# Patient Record
Sex: Male | Born: 1985 | Race: Black or African American | Hispanic: No | Marital: Single | Smoking: Current every day smoker
Health system: Southern US, Community
[De-identification: ages and names within clinical notes are randomized; demographics above are authoritative.]

## PROBLEM LIST (undated history)

## (undated) DIAGNOSIS — I10 Essential (primary) hypertension: Secondary | ICD-10-CM

## (undated) DIAGNOSIS — R569 Unspecified convulsions: Secondary | ICD-10-CM

---

## 2010-02-05 ENCOUNTER — Emergency Department (HOSPITAL_COMMUNITY)
Admission: EM | Admit: 2010-02-05 | Discharge: 2010-02-05 | Payer: Self-pay | Source: Home / Self Care | Admitting: Emergency Medicine

## 2010-06-24 LAB — GC/CHLAMYDIA PROBE AMP, GENITAL
Chlamydia, DNA Probe: NEGATIVE
GC Probe Amp, Genital: NEGATIVE

## 2017-02-26 ENCOUNTER — Emergency Department (HOSPITAL_COMMUNITY): Payer: Self-pay

## 2017-02-26 ENCOUNTER — Encounter (HOSPITAL_COMMUNITY): Payer: Self-pay

## 2017-02-26 ENCOUNTER — Emergency Department (HOSPITAL_COMMUNITY)
Admission: EM | Admit: 2017-02-26 | Discharge: 2017-02-26 | Disposition: A | Discharge status: Home / Self Care | Payer: Self-pay | Attending: Emergency Medicine | Admitting: Emergency Medicine

## 2017-02-26 DIAGNOSIS — F17200 Nicotine dependence, unspecified, uncomplicated: Secondary | ICD-10-CM | POA: Insufficient documentation

## 2017-02-26 DIAGNOSIS — Z880 Allergy status to penicillin: Secondary | ICD-10-CM | POA: Insufficient documentation

## 2017-02-26 DIAGNOSIS — J029 Acute pharyngitis, unspecified: Secondary | ICD-10-CM | POA: Insufficient documentation

## 2017-02-26 DIAGNOSIS — F10929 Alcohol use, unspecified with intoxication, unspecified: Secondary | ICD-10-CM | POA: Insufficient documentation

## 2017-02-26 DIAGNOSIS — K122 Cellulitis and abscess of mouth: Secondary | ICD-10-CM | POA: Insufficient documentation

## 2017-02-26 LAB — I-STAT CHEM 8, ED
BUN: 7 mg/dL (ref 6–20)
CALCIUM ION: 1.09 mmol/L — AB (ref 1.15–1.40)
CHLORIDE: 106 mmol/L (ref 101–111)
Creatinine, Ser: 1.1 mg/dL (ref 0.61–1.24)
GLUCOSE: 88 mg/dL (ref 65–99)
HCT: 38 % — ABNORMAL LOW (ref 39.0–52.0)
HEMOGLOBIN: 12.9 g/dL — AB (ref 13.0–17.0)
POTASSIUM: 3.6 mmol/L (ref 3.5–5.1)
SODIUM: 142 mmol/L (ref 135–145)
TCO2: 26 mmol/L (ref 22–32)

## 2017-02-26 LAB — ETHANOL: Alcohol, Ethyl (B): 484 mg/dL (ref <10)

## 2017-02-26 LAB — RAPID URINE DRUG SCREEN, HOSP PERFORMED
Amphetamines: NOT DETECTED
Barbiturates: NOT DETECTED
Benzodiazepines: NOT DETECTED
COCAINE: NOT DETECTED
OPIATES: NOT DETECTED
TETRAHYDROCANNABINOL: NOT DETECTED

## 2017-02-26 LAB — CBG MONITORING, ED: GLUCOSE-CAPILLARY: 90 mg/dL (ref 65–99)

## 2017-02-26 LAB — RAPID STREP SCREEN (MED CTR MEBANE ONLY): STREPTOCOCCUS, GROUP A SCREEN (DIRECT): NEGATIVE

## 2017-02-26 MED ORDER — SODIUM CHLORIDE 0.9 % IV BOLUS (SEPSIS)
1000.0000 mL | Freq: Once | INTRAVENOUS | Status: AC
  Administered 2017-02-26: 1000 mL via INTRAVENOUS

## 2017-02-26 MED ORDER — PREDNISONE 20 MG PO TABS
40.0000 mg | ORAL_TABLET | Freq: Once | ORAL | Status: AC
  Administered 2017-02-26: 40 mg via ORAL
  Filled 2017-02-26: qty 2
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
## 2017-02-26 NOTE — ED Notes (Signed)
Pt sitting in bed dressed, IV pulled out, monitor off.  Notified Dr. Rosalia Hammersay who states she will talk to the patient
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
## 2017-02-28 LAB — CULTURE, GROUP A STREP (THRC)

## 2020-10-16 ENCOUNTER — Other Ambulatory Visit: Payer: Self-pay

## 2020-10-16 ENCOUNTER — Emergency Department (HOSPITAL_COMMUNITY)
Admission: EM | Admit: 2020-10-16 | Discharge: 2020-10-16 | Disposition: A | Discharge status: Home / Self Care | Payer: Self-pay | Attending: Emergency Medicine | Admitting: Emergency Medicine

## 2020-10-16 ENCOUNTER — Encounter (HOSPITAL_COMMUNITY): Payer: Self-pay | Admitting: Oncology

## 2020-10-16 DIAGNOSIS — Y990 Civilian activity done for income or pay: Secondary | ICD-10-CM | POA: Insufficient documentation

## 2020-10-16 DIAGNOSIS — W1830XA Fall on same level, unspecified, initial encounter: Secondary | ICD-10-CM | POA: Insufficient documentation

## 2020-10-16 DIAGNOSIS — Z79899 Other long term (current) drug therapy: Secondary | ICD-10-CM | POA: Insufficient documentation

## 2020-10-16 DIAGNOSIS — R569 Unspecified convulsions: Secondary | ICD-10-CM

## 2020-10-16 DIAGNOSIS — G40909 Epilepsy, unspecified, not intractable, without status epilepticus: Secondary | ICD-10-CM | POA: Insufficient documentation

## 2020-10-16 DIAGNOSIS — Z91148 Patient's other noncompliance with medication regimen for other reason: Secondary | ICD-10-CM

## 2020-10-16 DIAGNOSIS — I10 Essential (primary) hypertension: Secondary | ICD-10-CM

## 2020-10-16 DIAGNOSIS — Z9114 Patient's other noncompliance with medication regimen: Secondary | ICD-10-CM | POA: Insufficient documentation

## 2020-10-16 DIAGNOSIS — F1290 Cannabis use, unspecified, uncomplicated: Secondary | ICD-10-CM

## 2020-10-16 DIAGNOSIS — F17200 Nicotine dependence, unspecified, uncomplicated: Secondary | ICD-10-CM | POA: Insufficient documentation

## 2020-10-16 DIAGNOSIS — S50311A Abrasion of right elbow, initial encounter: Secondary | ICD-10-CM | POA: Insufficient documentation

## 2020-10-16 DIAGNOSIS — F1490 Cocaine use, unspecified, uncomplicated: Secondary | ICD-10-CM

## 2020-10-16 HISTORY — DX: Unspecified convulsions: R56.9

## 2020-10-16 HISTORY — DX: Essential (primary) hypertension: I10

## 2020-10-16 LAB — COMPREHENSIVE METABOLIC PANEL
ALT: 39 U/L (ref 0–44)
AST: 82 U/L — ABNORMAL HIGH (ref 15–41)
Albumin: 4.4 g/dL (ref 3.5–5.0)
Alkaline Phosphatase: 43 U/L (ref 38–126)
Anion gap: 14 (ref 5–15)
BUN: 13 mg/dL (ref 6–20)
CO2: 29 mmol/L (ref 22–32)
Calcium: 10.2 mg/dL (ref 8.9–10.3)
Chloride: 95 mmol/L — ABNORMAL LOW (ref 98–111)
Creatinine, Ser: 1.03 mg/dL (ref 0.61–1.24)
GFR, Estimated: >60 mL/min (ref >60)
Glucose, Bld: 81 mg/dL (ref 70–99)
Potassium: 3.5 mmol/L (ref 3.5–5.1)
Sodium: 138 mmol/L (ref 135–145)
Total Bilirubin: 0.7 mg/dL (ref 0.3–1.2)
Total Protein: 8.3 g/dL — ABNORMAL HIGH (ref 6.5–8.1)

## 2020-10-16 LAB — CBC WITH DIFFERENTIAL/PLATELET
Abs Immature Granulocytes: 0.01 10*3/uL (ref 0.00–0.07)
Basophils Absolute: 0 10*3/uL (ref 0.0–0.1)
Basophils Relative: 1 %
Eosinophils Absolute: 0.1 10*3/uL (ref 0.0–0.5)
Eosinophils Relative: 2 %
HCT: 34.9 % — ABNORMAL LOW (ref 39.0–52.0)
Hemoglobin: 12.1 g/dL — ABNORMAL LOW (ref 13.0–17.0)
Immature Granulocytes: 0 %
Lymphocytes Relative: 18 %
Lymphs Abs: 0.8 10*3/uL (ref 0.7–4.0)
MCH: 32.6 pg (ref 26.0–34.0)
MCHC: 34.7 g/dL (ref 30.0–36.0)
MCV: 94.1 fL (ref 80.0–100.0)
Monocytes Absolute: 0.6 10*3/uL (ref 0.1–1.0)
Monocytes Relative: 14 %
Neutro Abs: 2.9 10*3/uL (ref 1.7–7.7)
Neutrophils Relative %: 65 %
Platelets: 315 10*3/uL (ref 150–400)
RBC: 3.71 MIL/uL — ABNORMAL LOW (ref 4.22–5.81)
RDW: 13.7 % (ref 11.5–15.5)
WBC: 4.5 10*3/uL (ref 4.0–10.5)
nRBC: 0 % (ref 0.0–0.2)

## 2020-10-16 LAB — URINALYSIS, ROUTINE W REFLEX MICROSCOPIC
Bacteria, UA: NONE SEEN
Glucose, UA: NEGATIVE mg/dL
Hgb urine dipstick: NEGATIVE
Ketones, ur: 5 mg/dL — AB
Leukocytes,Ua: NEGATIVE
Nitrite: NEGATIVE
Protein, ur: >=300 mg/dL — AB
Specific Gravity, Urine: 1.025 (ref 1.005–1.030)
pH: 7 (ref 5.0–8.0)

## 2020-10-16 LAB — RAPID URINE DRUG SCREEN, HOSP PERFORMED
Amphetamines: NOT DETECTED
Barbiturates: NOT DETECTED
Benzodiazepines: NOT DETECTED
Cocaine: POSITIVE — AB
Opiates: NOT DETECTED
Tetrahydrocannabinol: POSITIVE — AB

## 2020-10-16 LAB — CBG MONITORING, ED: Glucose-Capillary: 90 mg/dL (ref 70–99)

## 2020-10-16 LAB — MAGNESIUM: Magnesium: 1.3 mg/dL — ABNORMAL LOW (ref 1.7–2.4)

## 2020-10-16 MED ORDER — SODIUM CHLORIDE 0.9 % IV SOLN: INTRAVENOUS | Status: DC

## 2020-10-16 MED ORDER — MAGNESIUM SULFATE 2 GM/50ML IV SOLN
2.0000 g | Freq: Once | INTRAVENOUS | Status: AC
  Administered 2020-10-16: 2 g via INTRAVENOUS
  Filled 2020-10-16: qty 50

## 2020-10-16 MED ORDER — AMLODIPINE BESYLATE 5 MG PO TABS
5.0000 mg | ORAL_TABLET | Freq: Once | ORAL | Status: AC
  Administered 2020-10-16: 5 mg via ORAL
  Filled 2020-10-16: qty 1

## 2020-10-16 MED ORDER — LEVETIRACETAM 500 MG PO TABS: 500.0000 mg | ORAL_TABLET | Freq: Two times a day (BID) | ORAL | 1 refills | Status: DC

## 2020-10-16 MED ORDER — AMLODIPINE BESYLATE 5 MG PO TABS: 5.0000 mg | ORAL_TABLET | Freq: Every day | ORAL | 1 refills | Status: DC

## 2020-10-16 MED ORDER — LEVETIRACETAM IN NACL 1000 MG/100ML IV SOLN
1000.0000 mg | Freq: Once | INTRAVENOUS | Status: AC
  Administered 2020-10-16: 1000 mg via INTRAVENOUS
  Filled 2020-10-16: qty 100

## 2020-10-16 NOTE — ED Triage Notes (Signed)
Pt bib [facility] from work s/p witnessed seizure.  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  Reports seizure lasted approximately 45 seconds to 1 minute.  Pt w/ hx of seizures and HTN has not been on his medications recently d/t no PCP.

## 2020-10-16 NOTE — ED Notes (Signed)
Per pt request, urinal placed at bedside.

## 2020-10-16 NOTE — ED Provider Notes (Signed)
[facility]-EMERGENCY DEPT Provider Note   CSN: [id number] Arrival date & time: 10/16/20  1628     History Chief Complaint  Patient presents with   Seizures    Ronald Avila is a 35 y.o. male.  Pt presents to the ED today with a witnessed seizure.  Pt was at work and a co-worker witnessed seizure activity for about 45 sec.  Pt has a hx of seizure d/o, but has not taken any of his meds for several months due to lack of insurance.  EMS said pt was post-ictal when they first arrived, but is now back to nl.  Pt said he has been sleeping ok.  He hit his right elbow, but he is able to move it without any problems.  Pt does not want an xray of the elbow.      Past Medical History:  Diagnosis Date   Hypertension    Seizure (HCC)    dx "Few years ago."    There are no problems to display for this patient.   History reviewed. No pertinent surgical history.     No family history on file.  Social History   Tobacco Use   Smoking status: Every Day    Pack years: 0.00   Smokeless tobacco: Current  Vaping Use   Vaping Use: Never used  Substance Use Topics   Alcohol use: Not Currently   Drug use: No    Home Medications Prior to Admission medications   Medication Sig Start Date End Date Taking? Authorizing Provider  amLODipine (NORVASC) 5 MG tablet Take 1 tablet (5 mg total) by mouth daily. 10/16/20  Yes Jacalyn Lefevre, MD  levETIRAcetam (KEPPRA) 500 MG tablet Take 1 tablet (500 mg total) by mouth 2 (two) times daily. 10/16/20  Yes Jacalyn Lefevre, MD  ibuprofen (ADVIL,MOTRIN) 200 MG tablet Take 200 mg every 6 (six) hours as needed by mouth (pain).    [provider]    Allergies    Amoxicillin-pot clavulanate and Penicillins  Review of Systems   Review of Systems  Neurological:  Positive for seizures.  All other systems reviewed and are negative.  Physical Exam Updated Vital Signs BP (!) 140/100   Pulse 85   Temp 98.1 F (36.7 C) (Oral)    Resp 15   Ht 5\' 4"  (1.626 m)   Wt 59 kg   SpO2 99%   BMI 22.31 kg/m   Physical Exam Vitals and nursing note reviewed.  Constitutional:      Appearance: Normal appearance.  HENT:     Head: Normocephalic and atraumatic.     Right Ear: External ear normal.     Left Ear: External ear normal.     Nose: Nose normal.     Mouth/Throat:     Mouth: Mucous membranes are moist.     Pharynx: Oropharynx is clear.  Eyes:     Extraocular Movements: Extraocular movements intact.     Conjunctiva/sclera: Conjunctivae normal.     Pupils: Pupils are equal, round, and reactive to light.  Cardiovascular:     Rate and Rhythm: Normal rate and regular rhythm.     Pulses: Normal pulses.     Heart sounds: Normal heart sounds.  Pulmonary:     Effort: Pulmonary effort is normal.     Breath sounds: Normal breath sounds.  Abdominal:     General: Abdomen is flat. Bowel sounds are normal.     Palpations: Abdomen is soft.  Musculoskeletal:  Cervical back: Normal range of motion and neck supple.     Comments: Abrasion to right elbow.  Good rom.  Skin:    General: Skin is warm.     Capillary Refill: Capillary refill takes less than 2 seconds.  Neurological:     General: No focal deficit present.     Mental Status: He is alert and oriented to person, place, and time.  Psychiatric:        Mood and Affect: Mood normal.        Behavior: Behavior normal.    ED Results / Procedures / Treatments   Labs (all labs ordered are listed, but only abnormal results are displayed) Labs Reviewed  CBC WITH DIFFERENTIAL/PLATELET - Abnormal; Notable for the following components:      Result Value   RBC 3.71 (*)    Hemoglobin 12.1 (*)    HCT 34.9 (*)    All other components within normal limits  COMPREHENSIVE METABOLIC PANEL - Abnormal; Notable for the following components:   Chloride 95 (*)    Total Protein 8.3 (*)    AST 82 (*)    All other components within normal limits  MAGNESIUM - Abnormal; Notable  for the following components:   Magnesium 1.3 (*)    All other components within normal limits  URINALYSIS, ROUTINE W REFLEX MICROSCOPIC - Abnormal; Notable for the following components:   Bilirubin Urine SMALL (*)    Ketones, ur 5 (*)    Protein, ur >=300 (*)    All other components within normal limits  RAPID URINE DRUG SCREEN, HOSP PERFORMED - Abnormal; Notable for the following components:   Cocaine POSITIVE (*)    Tetrahydrocannabinol POSITIVE (*)    All other components within normal limits  CBG MONITORING, ED    EKG None  Radiology No results found.  Procedures Procedures   Medications Ordered in ED Medications  0.9 %  sodium chloride infusion ( Intravenous New Bag/Given 10/16/20 1758)  magnesium sulfate IVPB 2 g 50 mL (2 g Intravenous New Bag/Given 10/16/20 1905)  levETIRAcetam (KEPPRA) IVPB 1000 mg/100 mL premix (0 mg Intravenous Stopped 10/16/20 1757)  amLODipine (NORVASC) tablet 5 mg (5 mg Oral Given 10/16/20 1902)    ED Course  I have reviewed the triage vital signs and the nursing notes.  Pertinent labs & imaging results that were available during my care of the patient were reviewed by me and considered in my medical decision making (see chart for details).    MDM Rules/Calculators/A&P                          SW did see pt and will help him get a pcp.  She will also help him with the keppra and norvasc.   His drug screen + for cocaine and mj.  He is encouraged to avoid these drugs.  He is to return if worse.  Establish care with pcp. Final Clinical Impression(s) / ED Diagnoses Final diagnoses:  Seizure (HCC)  Noncompliance with medications  Hypertension, unspecified type  Cocaine use  Marijuana use    Rx / DC Orders ED Discharge Orders          Ordered    levETIRAcetam (KEPPRA) 500 MG tablet  2 times daily        10/16/20 1833    amLODipine (NORVASC) 5 MG tablet  Daily        10/16/20 1833  Jacalyn Lefevre, MD 10/16/20 [phone]

## 2020-10-16 NOTE — Progress Notes (Addendum)
.  Transition of Care [facility]) - Emergency Department Mini Assessment   Patient Details  Name: Ronald Avila MRN: [id number] Date of Birth: [birth year]  Transition of [facility]) CM/SW Contact:    Elliot Cousin, RN Phone Number: [phone] 10/16/2020, 5:52 PM   Clinical Narrative: TOC CM spoke to pt and explained [facility] has discounted price for meds that he was taking in the past. Pt wants to use [employer], added this to his profile. Provided pt with contact number for [facility] Care at [facility] to arrange appt to see PCP on his dc paperwork to call to schedule appt. He will text CM with appt time on tomorrow. ED provided updated. Appts are out to Sept, ED provided updated.    ED Mini Assessment: What brought you to the Emergency Department? : seizures  Barriers to Discharge: No Barriers Identified  Barrier interventions: [facility] information to arrange PCP, mediation assistance     Interventions which prevented an admission or readmission: Medication Review, Follow-up medical appointment    Patient Contact and Communications   Admission diagnosis:  seizure There are no problems to display for this patient.  PCP:  Patient, No Pcp Per (Inactive) Pharmacy:   [facility] [address] [place] - [address] [place] [address] Phone: [phone] Fax: [phone]

## 2020-10-16 NOTE — Discharge Instructions (Addendum)
Avoid cocaine and marijuana.  You need to establish care with a primary care doctor and then a neurologist.

## 2020-12-09 ENCOUNTER — Emergency Department (HOSPITAL_COMMUNITY)
Admission: EM | Admit: 2020-12-09 | Discharge: 2020-12-10 | Disposition: A | Discharge status: Home / Self Care | Payer: Self-pay | Attending: Emergency Medicine | Admitting: Emergency Medicine

## 2020-12-09 DIAGNOSIS — Z23 Encounter for immunization: Secondary | ICD-10-CM | POA: Insufficient documentation

## 2020-12-09 DIAGNOSIS — Z79899 Other long term (current) drug therapy: Secondary | ICD-10-CM | POA: Insufficient documentation

## 2020-12-09 DIAGNOSIS — S01112A Laceration without foreign body of left eyelid and periocular area, initial encounter: Secondary | ICD-10-CM | POA: Insufficient documentation

## 2020-12-09 DIAGNOSIS — W01198A Fall on same level from slipping, tripping and stumbling with subsequent striking against other object, initial encounter: Secondary | ICD-10-CM | POA: Insufficient documentation

## 2020-12-09 DIAGNOSIS — Y92000 Kitchen of unspecified non-institutional (private) residence as  the place of occurrence of the external cause: Secondary | ICD-10-CM | POA: Insufficient documentation

## 2020-12-09 DIAGNOSIS — S0181XA Laceration without foreign body of other part of head, initial encounter: Secondary | ICD-10-CM

## 2020-12-09 DIAGNOSIS — F17200 Nicotine dependence, unspecified, uncomplicated: Secondary | ICD-10-CM | POA: Insufficient documentation

## 2020-12-09 DIAGNOSIS — S0990XA Unspecified injury of head, initial encounter: Secondary | ICD-10-CM

## 2020-12-09 DIAGNOSIS — I10 Essential (primary) hypertension: Secondary | ICD-10-CM | POA: Insufficient documentation
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
## 2020-12-09 NOTE — ED Triage Notes (Signed)
Pt states he had fell last night, hit head. Refused transport by EMS. States similar episode happened today, scared to wash face "cause it'll hurt." States he needs stitches. Endorses ETOH last night, unsure if passed out or had seizure. Hx seizures, HTN, states compliance w medication

## 2020-12-10 ENCOUNTER — Emergency Department (HOSPITAL_COMMUNITY): Payer: Self-pay

## 2020-12-10 MED ORDER — LIDOCAINE-EPINEPHRINE-TETRACAINE (LET) TOPICAL GEL
3.0000 mL | Freq: Once | TOPICAL | Status: AC
  Administered 2020-12-10: 3 mL via TOPICAL
  Filled 2020-12-10: qty 3

## 2020-12-10 MED ORDER — AMLODIPINE BESYLATE 5 MG PO TABS: 5.0000 mg | ORAL_TABLET | Freq: Every day | ORAL | 0 refills | Status: AC

## 2020-12-10 MED ORDER — LEVETIRACETAM 500 MG PO TABS
1000.0000 mg | ORAL_TABLET | Freq: Once | ORAL | Status: AC
  Administered 2020-12-10: 1000 mg via ORAL
  Filled 2020-12-10: qty 2

## 2020-12-10 MED ORDER — AMLODIPINE BESYLATE 5 MG PO TABS
5.0000 mg | ORAL_TABLET | Freq: Once | ORAL | Status: AC
  Administered 2020-12-10: 5 mg via ORAL
  Filled 2020-12-10: qty 1

## 2020-12-10 MED ORDER — TETANUS-DIPHTH-ACELL PERTUSSIS 5-2.5-18.5 LF-MCG/0.5 IM SUSY
0.5000 mL | PREFILLED_SYRINGE | Freq: Once | INTRAMUSCULAR | Status: AC
  Administered 2020-12-10: 0.5 mL via INTRAMUSCULAR
  Filled 2020-12-10: qty 0.5

## 2020-12-10 MED ORDER — DOXYCYCLINE HYCLATE 100 MG PO CAPS: 100.0000 mg | ORAL_CAPSULE | Freq: Two times a day (BID) | ORAL | 0 refills | Status: AC

## 2020-12-10 MED ORDER — LEVETIRACETAM 500 MG PO TABS: 500.0000 mg | ORAL_TABLET | Freq: Two times a day (BID) | ORAL | 0 refills | Status: AC

## 2020-12-10 NOTE — ED Notes (Signed)
Patient transported to CT 

## 2020-12-10 NOTE — Discharge Instructions (Addendum)
It is important for you to take your blood pressure and seizure medication as prescribed. Take Doxycycline to prevent infection in the facial laceration that was stitched.   Take ibuprofen as needed for discomfort.

## 2020-12-10 NOTE — ED Provider Notes (Signed)
[facility] EMERGENCY DEPARTMENT Provider Note   CSN: [id number] Arrival date & time: 12/09/20  2157     History Chief Complaint  Patient presents with   Seizures   Laceration   Fall    Ronald Avila is a 35 y.o. male.  Patient with history of seizures, HTN, medication noncompliance, presents with facial laceration from falling while at home and hitting the kitchen counter. He reports LOC. The episode was witnessed. No seizure activity was reported. He admits to not taking his Keppra in "I don't know how long". No nausea, vomiting. EMS was called to the house at the time of the fall (about 9 hours ago) and he reports he refused transport to the hospital. He states he got up tonight and saw the extent of the laceration, prompting ED evaluation. No neck pain, chest or abdominal pain, SOB, nausea or vomiting    Seizures Laceration Associated symptoms: no fever   Fall Pertinent negatives include no chest pain, no abdominal pain and no shortness of breath.      Past Medical History:  Diagnosis Date   Hypertension    Seizure (HCC)    dx "Few years ago."    There are no problems to display for this patient.   No past surgical history on file.     No family history on file.  Social History   Tobacco Use   Smoking status: Every Day   Smokeless tobacco: Current  Vaping Use   Vaping Use: Never used  Substance Use Topics   Alcohol use: Not Currently   Drug use: No    Home Medications Prior to Admission medications   Medication Sig Start Date End Date Taking? Authorizing Provider  amLODipine (NORVASC) 5 MG tablet Take 1 tablet (5 mg total) by mouth daily. 10/16/20   Jacalyn Lefevre, MD  ibuprofen (ADVIL,MOTRIN) 200 MG tablet Take 200 mg every 6 (six) hours as needed by mouth (pain).    [provider]  levETIRAcetam (KEPPRA) 500 MG tablet Take 1 tablet (500 mg total) by mouth 2 (two) times daily. 10/16/20   Jacalyn Lefevre, MD    Allergies     Amoxicillin-pot clavulanate and Penicillins  Review of Systems   Review of Systems  Constitutional:  Negative for fever.  HENT:  Positive for facial swelling.   Eyes:  Negative for visual disturbance.  Respiratory:  Negative for shortness of breath.   Cardiovascular:  Negative for chest pain.  Gastrointestinal:  Negative for abdominal pain, nausea and vomiting.  Musculoskeletal:  Negative for back pain and neck pain.  Skin:  Positive for wound.  Neurological:  Positive for seizures and syncope.   Physical Exam Updated Vital Signs BP (!) 165/118   Pulse 90   Temp 98.5 F (36.9 C) (Oral)   Resp 15   SpO2 100%   Physical Exam Vitals and nursing note reviewed.  Constitutional:      Appearance: He is well-developed.  HENT:     Head: Normocephalic.  Cardiovascular:     Rate and Rhythm: Normal rate and regular rhythm.     Heart sounds: No murmur heard. Pulmonary:     Effort: Pulmonary effort is normal.     Breath sounds: Normal breath sounds. No wheezing, rhonchi or rales.  Abdominal:     General: Bowel sounds are normal.     Palpations: Abdomen is soft.     Tenderness: There is no abdominal tenderness. There is no guarding or rebound.  Musculoskeletal:  General: Normal range of motion.     Cervical back: Normal range of motion and neck supple.  Skin:    General: Skin is warm and dry.     Comments: 6 cm crescent shaped laceration across forehead above the left eyebrow.   Neurological:     General: No focal deficit present.     Mental Status: He is alert and oriented to person, place, and time.    ED Results / Procedures / Treatments   Labs (all labs ordered are listed, but only abnormal results are displayed) Labs Reviewed - No data to display  EKG None  Radiology No results found. CT HEAD WO CONTRAST ( )  Result Date: 12/10/2020 CLINICAL DATA:  Seizure with fall. EXAM: CT HEAD WITHOUT CONTRAST CT MAXILLOFACIAL WITHOUT CONTRAST TECHNIQUE: Multidetector  CT imaging of the head and maxillofacial structures were performed using the standard protocol without intravenous contrast. Multiplanar CT image reconstructions of the maxillofacial structures were also generated. COMPARISON:  Head CT 02/26/2017. FINDINGS: CT HEAD FINDINGS Brain: There is no evidence for acute hemorrhage, hydrocephalus, mass lesion, or abnormal extra-axial fluid collection. No definite CT evidence for acute infarction. Vascular: No hyperdense vessel or unexpected calcification. Skull: No evidence for fracture. No worrisome lytic or sclerotic lesion. Other: None. CT MAXILLOFACIAL FINDINGS Osseous: No fracture or mandibular dislocation. No destructive process. Orbits: Negative. No traumatic or inflammatory finding. Sinuses: Clear. Soft tissues: Soft tissue contusion noted left orbital and frontal scalp region. IMPRESSION: 1. Unremarkable CT evaluation of the brain. 2. Soft tissue contusion left orbital and frontal scalp region. No underlying maxillofacial fracture. Electronically Signed   By: Kennith Center M.D.   On: 12/10/2020 06:15   CT Maxillofacial Wo Contrast  Result Date: 12/10/2020 CLINICAL DATA:  Seizure with fall. EXAM: CT HEAD WITHOUT CONTRAST CT MAXILLOFACIAL WITHOUT CONTRAST TECHNIQUE: Multidetector CT imaging of the head and maxillofacial structures were performed using the standard protocol without intravenous contrast. Multiplanar CT image reconstructions of the maxillofacial structures were also generated. COMPARISON:  Head CT 02/26/2017. FINDINGS: CT HEAD FINDINGS Brain: There is no evidence for acute hemorrhage, hydrocephalus, mass lesion, or abnormal extra-axial fluid collection. No definite CT evidence for acute infarction. Vascular: No hyperdense vessel or unexpected calcification. Skull: No evidence for fracture. No worrisome lytic or sclerotic lesion. Other: None. CT MAXILLOFACIAL FINDINGS Osseous: No fracture or mandibular dislocation. No destructive process. Orbits:  Negative. No traumatic or inflammatory finding. Sinuses: Clear. Soft tissues: Soft tissue contusion noted left orbital and frontal scalp region. IMPRESSION: 1. Unremarkable CT evaluation of the brain. 2. Soft tissue contusion left orbital and frontal scalp region. No underlying maxillofacial fracture. Electronically Signed   By: Kennith Center M.D.   On: 12/10/2020 06:15    Procedures .Marland KitchenLaceration Repair  Date/Time: 12/10/2020 7:36 AM Performed by: Elpidio Anis, PA-C Authorized by: Elpidio Anis, PA-C   Consent:    Consent obtained:  Verbal   Consent given by:  Patient Universal protocol:    Procedure explained and questions answered to patient or proxy's satisfaction: yes     Immediately prior to procedure, a time out was called: yes     Patient identity confirmed:  Verbally with patient Laceration details:    Location:  Face   Face location:  L eyebrow   Length (cm):  6 Pre-procedure details:    Preparation:  Patient was prepped and draped in usual sterile fashion Treatment:    Area cleansed with:  Povidone-iodine and saline   Layers/structures repaired:  Deep dermal/superficial fascia Deep  dermal/superficial fascia:    Suture size:  5-0   Suture material:  Vicryl   Suture technique:  Vertical mattress   Number of sutures:  2 Skin repair:    Repair method:  Sutures   Suture size:  6-0   Suture material:  Prolene   Suture technique:  Running   Number of sutures:  16 Approximation:    Approximation:  Close Repair type:    Repair type:  Intermediate Post-procedure details:    Dressing:  Antibiotic ointment   Medications Ordered in ED Medications  levETIRAcetam (KEPPRA) tablet 1,000 mg (has no administration in time range)  Tdap (BOOSTRIX) injection 0.5 mL (has no administration in time range)    ED Course  I have reviewed the triage vital signs and the nursing notes.  Pertinent labs & imaging results that were available during my care of the patient were reviewed  by me and considered in my medical decision making (see chart for details).    MDM Rules/Calculators/A&P                           Patient to ED with facial laceration after fall. The fall was witnessed and no seizure activity reported. He arrives to the ED about 9-10 after injury.   Imaging pending. Facial laceration will need to be repaired, despite age of wound. Will cover with abx.   Wound repaired as per procedure note above. CT's negative for intracranial or facial bone injury. The patient remains awake, alert, oriented. Stable for discharge.   His blood pressure has been elevated since arrival. He is noncompliant with Keppra as well as his amlodipine. Will restart both. Discussed importance of BP and seizure control.   Final Clinical Impression(s) / ED Diagnoses Final diagnoses:  None   Fall Facial laceration   Rx / DC Orders ED Discharge Orders     None        Elpidio Anis, PA-C 12/13/20 0741    Nira Conn, MD 12/16/20 [phone]

## 2023-04-26 IMAGING — CT CT MAXILLOFACIAL W/O CM
3 series · 15 of 47 positions shown, 18 images · non-contrast
Comparison: Head CT 02/26/2017.

CLINICAL DATA: Seizure with fall.

EXAM:
CT HEAD WITHOUT CONTRAST
CT MAXILLOFACIAL WITHOUT CONTRAST
TECHNIQUE: Multidetector CT imaging of the head and maxillofacial structures
were performed using the standard protocol without intravenous
contrast. Multiplanar CT image reconstructions of the maxillofacial
structures were also generated.

[Series 3: facial/ orbits 2.0 h30s · axial · 0.37mm/px · z∈[+240,+396]mm · 9 of 92 slices shown, 12 images]
[im 7/92  brain]
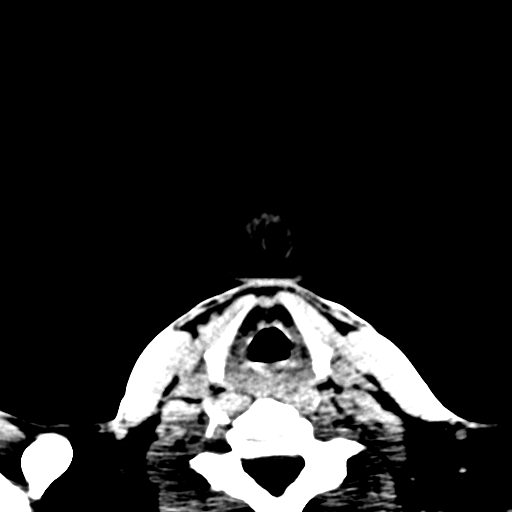
[im 7/92  bone]
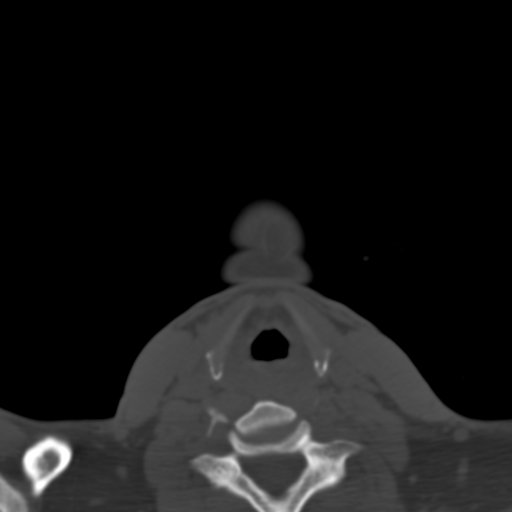
[im 16/92  bone]
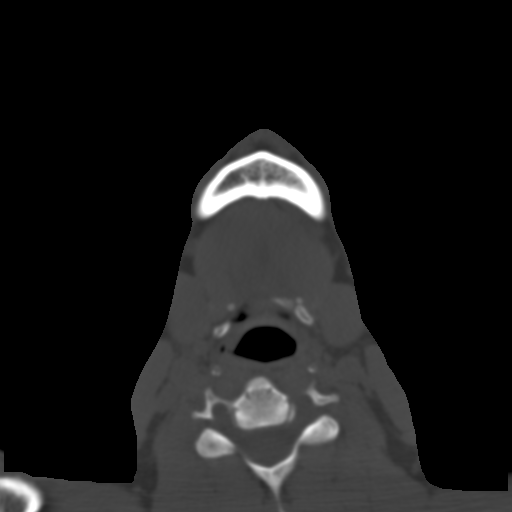
[im 26/92  bone]
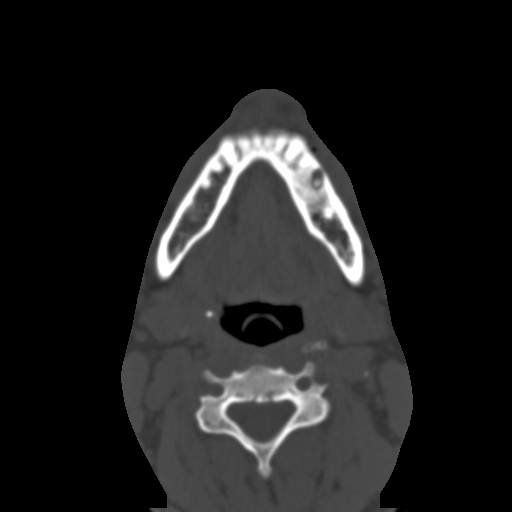
[im 35/92  bone]
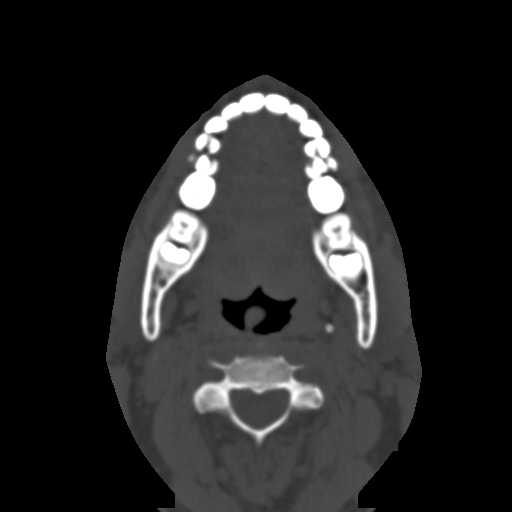
[im 48/92  brain]
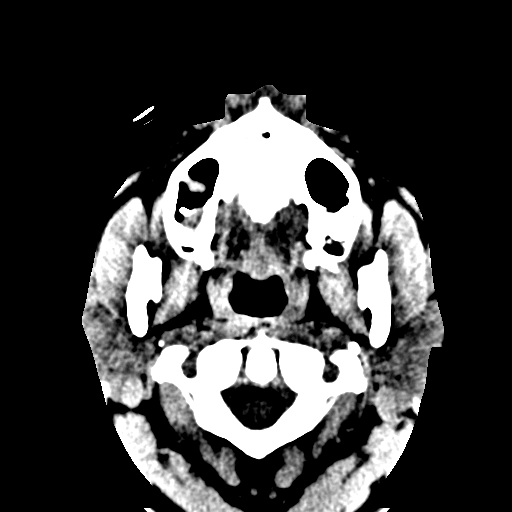
[im 48/92  bone]
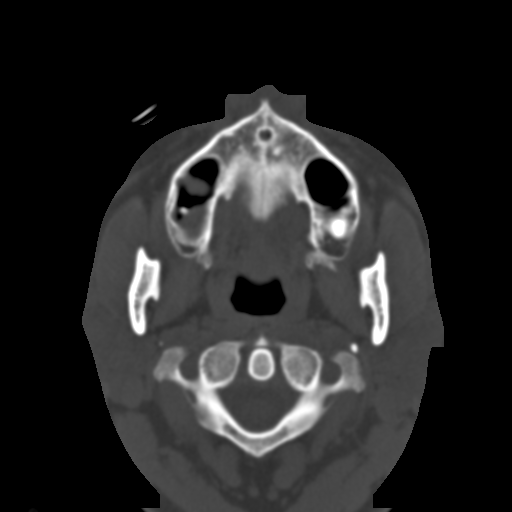
[im 57/92  bone]
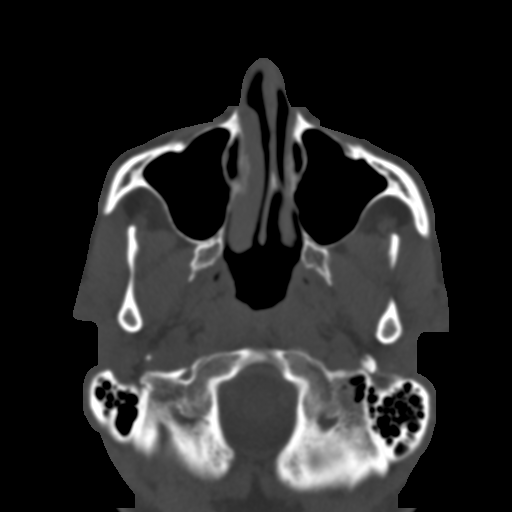
[im 66/92  bone]
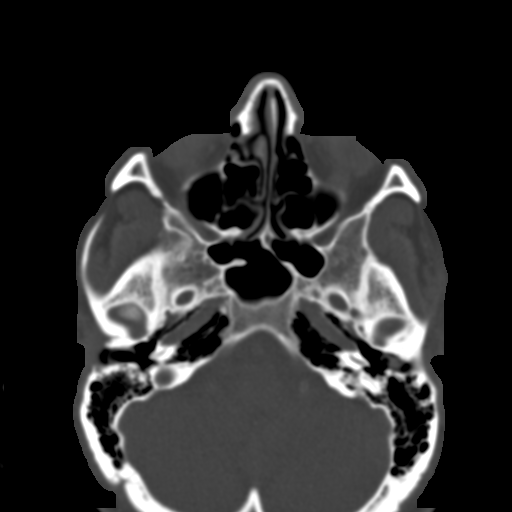
[im 76/92  bone]
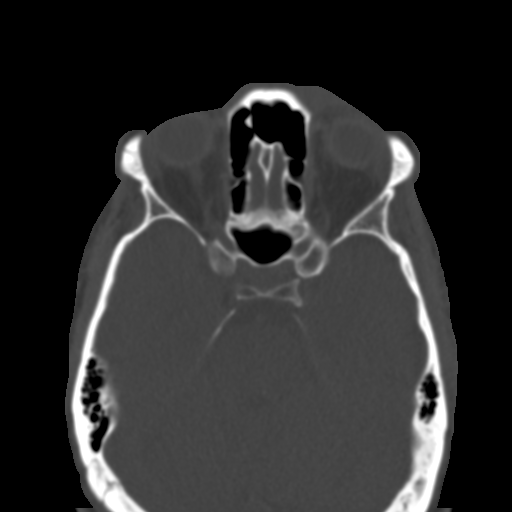
[im 85/92  brain]
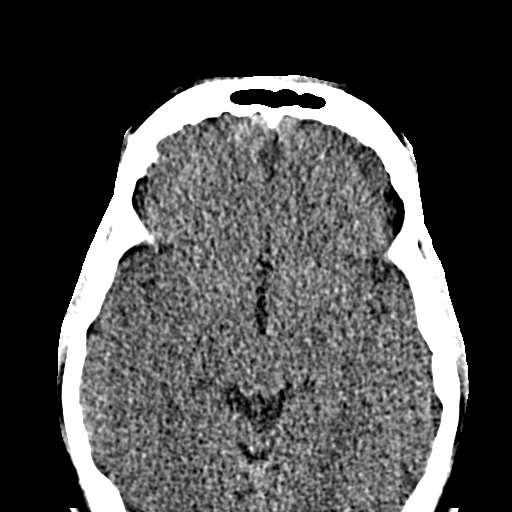
[im 85/92  bone]
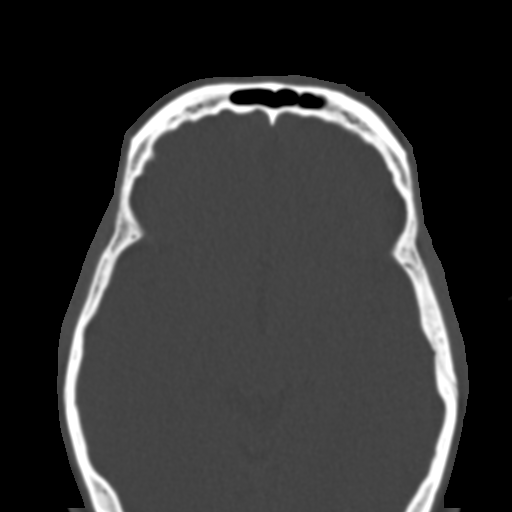

[Series 7: coronal soft tissue · coronal · 0.36mm/px · 3 of 85 slices shown]
[im 29/85  bone]
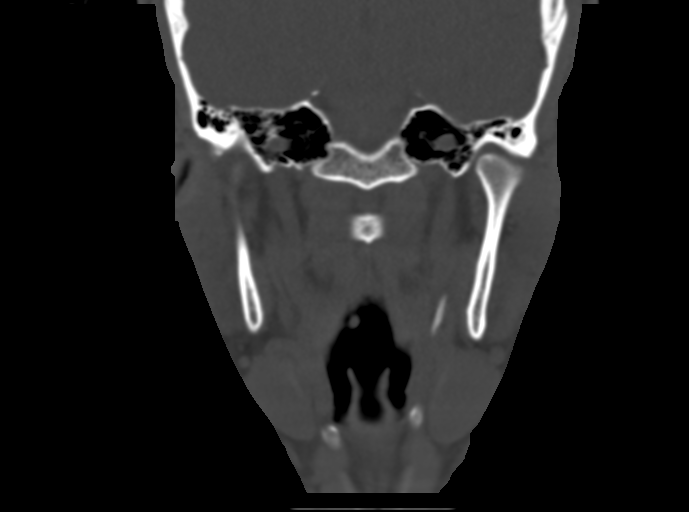
[im 38/85  bone]
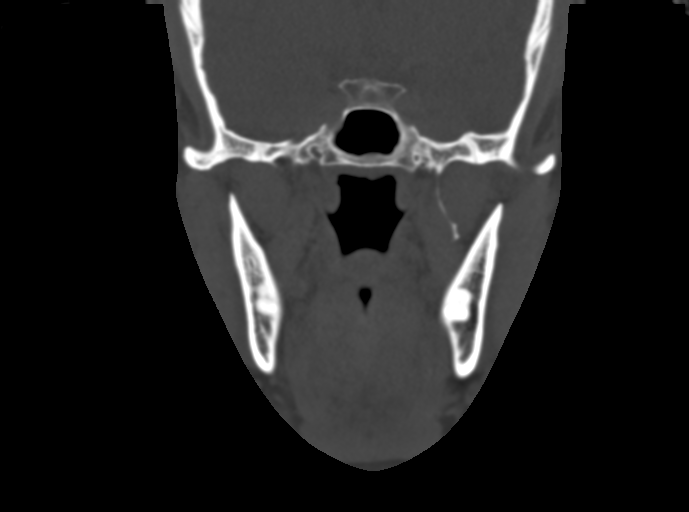
[im 47/85  bone]
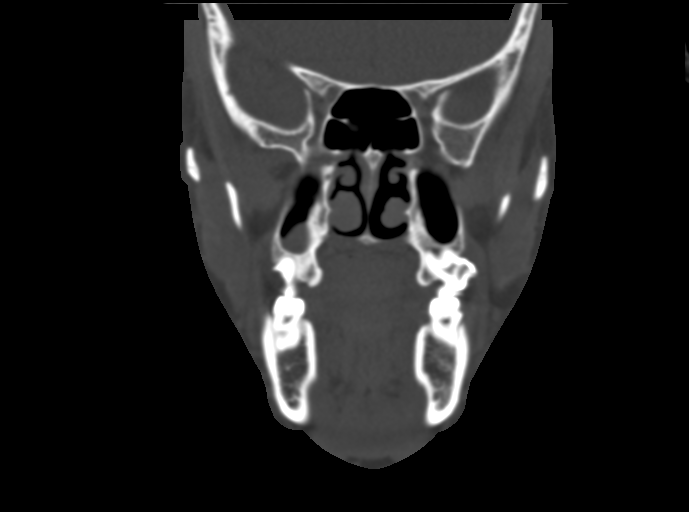

[Series 8: sagittal soft tissue · sagittal · 0.36mm/px · 3 of 92 slices shown]
[im 31/92  bone]
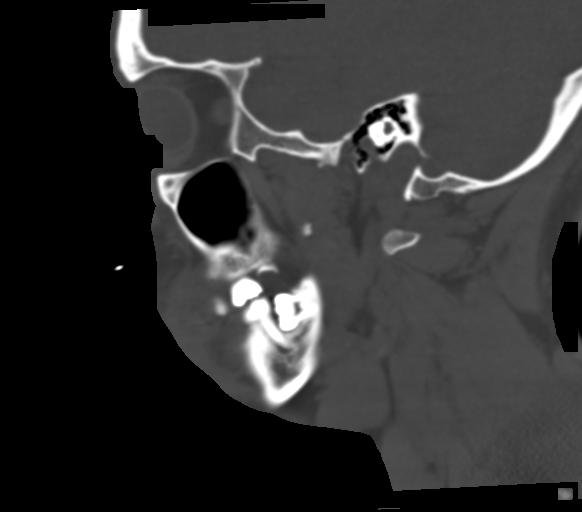
[im 46/92  bone]
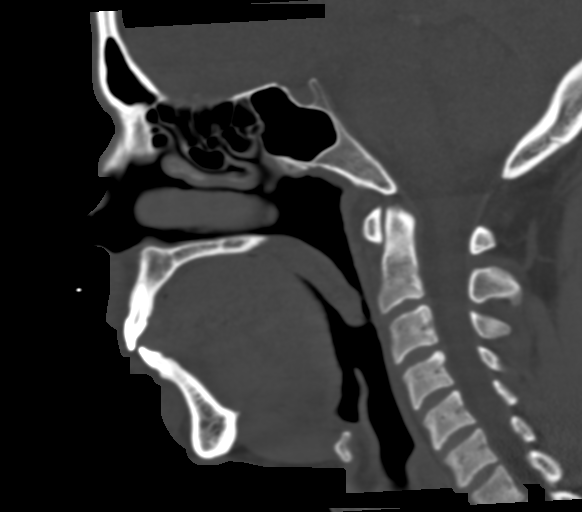
[im 61/92  bone]
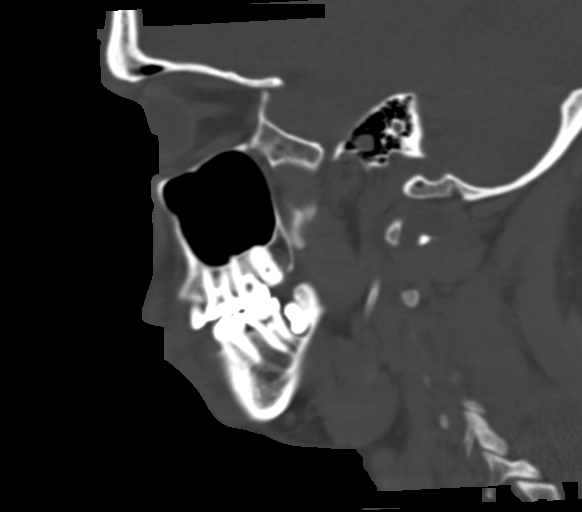

[15 of 47 positions shown; findings below may reference images not displayed]

FINDINGS: CT HEAD FINDINGS

Brain: There is no evidence for acute hemorrhage, hydrocephalus,
mass lesion, or abnormal extra-axial fluid collection. No definite
CT evidence for acute infarction.

Vascular: No hyperdense vessel or unexpected calcification.

Skull: No evidence for fracture. No worrisome lytic or sclerotic
lesion.

Other: None.

CT MAXILLOFACIAL FINDINGS

Osseous: No fracture or mandibular dislocation. No destructive
process.

Orbits: Negative. No traumatic or inflammatory finding.

Sinuses: Clear.

Soft tissues: Soft tissue contusion noted left orbital and frontal
scalp region.
IMPRESSION: 1. Unremarkable CT evaluation of the brain.
2. Soft tissue contusion left orbital and frontal scalp region. No
underlying maxillofacial fracture.
